# Patient Record
Sex: Female | Born: 1950 | Race: Black or African American | Hispanic: No | Marital: Married
Health system: Southern US, Community
[De-identification: ages and names within clinical notes are randomized; demographics above are authoritative.]

---

## 2001-04-14 ENCOUNTER — Encounter: Admission: RE | Admit: 2001-04-14 | Discharge: 2001-04-14 | Payer: Self-pay | Admitting: Family Medicine

## 2001-04-14 ENCOUNTER — Encounter: Payer: Self-pay | Admitting: Family Medicine

## 2002-04-15 ENCOUNTER — Encounter: Admission: RE | Admit: 2002-04-15 | Discharge: 2002-04-15 | Payer: Self-pay | Admitting: Family Medicine

## 2002-04-15 ENCOUNTER — Encounter: Payer: Self-pay | Admitting: Family Medicine

## 2005-04-23 ENCOUNTER — Encounter: Admission: RE | Admit: 2005-04-23 | Discharge: 2005-04-23 | Payer: Self-pay | Admitting: Family Medicine

## 2007-12-14 ENCOUNTER — Other Ambulatory Visit: Admission: RE | Admit: 2007-12-14 | Discharge: 2007-12-14 | Payer: Self-pay | Admitting: Family Medicine

## 2007-12-16 ENCOUNTER — Encounter: Admission: RE | Admit: 2007-12-16 | Discharge: 2007-12-16 | Payer: Self-pay | Admitting: Family Medicine

## 2008-12-19 ENCOUNTER — Encounter: Admission: RE | Admit: 2008-12-19 | Discharge: 2008-12-19 | Payer: Self-pay | Admitting: Family Medicine

## 2008-12-22 ENCOUNTER — Encounter: Admission: RE | Admit: 2008-12-22 | Discharge: 2008-12-22 | Payer: Self-pay | Admitting: Family Medicine

## 2009-06-20 ENCOUNTER — Encounter: Admission: RE | Admit: 2009-06-20 | Discharge: 2009-06-20 | Payer: Self-pay | Admitting: Family Medicine

## 2009-12-19 ENCOUNTER — Encounter: Admission: RE | Admit: 2009-12-19 | Discharge: 2009-12-19 | Payer: Self-pay | Admitting: Family Medicine

## 2010-04-01 ENCOUNTER — Ambulatory Visit: Payer: Self-pay | Admitting: Vascular Surgery

## 2010-10-29 NOTE — Procedures (Signed)
DUPLEX DEEP VENOUS EXAM - LOWER EXTREMITY   INDICATION:  Pain and swelling.   HISTORY:  Edema:  Complaint of left greater than right lower extremity  swelling for 2 weeks.  Trauma/Surgery:  No.  Pain:  Left thigh pain, mostly at night.  PE:  No.  Previous DVT:  No.  Anticoagulants:  Other:   DUPLEX EXAM:                CFV   SFV   PopV  PTV    GSV                R  L  R  L  R  L  R   L  R  L  Thrombosis    o  o     o     o      o     o  Spontaneous   +  +     +     +      +     +  Phasic        +  +     +     +      +     +  Augmentation  +  +     +     +      +     +  Compressible  +  +     +     +      +     +  Competent     +  +     +     +      +     +   Legend:  + - yes  o - no  p - partial  D - decreased   IMPRESSION:  1. No evidence of deep or superficial venous thrombosis noted in the      left lower extremity.  2. No cystic structures were visualized within the left popliteal      fossa.    _____________________________  Janetta Hora. Fields, MD   CH/MEDQ  D:  04/02/2010  T:  04/02/2010  Job:  [id number]

## 2010-12-11 ENCOUNTER — Other Ambulatory Visit (HOSPITAL_COMMUNITY)
Admission: RE | Admit: 2010-12-11 | Discharge: 2010-12-11 | Disposition: A | Discharge status: Home / Self Care | Payer: BC Managed Care – PPO | Source: Ambulatory Visit | Attending: Family Medicine | Admitting: Family Medicine

## 2010-12-11 DIAGNOSIS — Z124 Encounter for screening for malignant neoplasm of cervix: Secondary | ICD-10-CM | POA: Insufficient documentation

## 2011-01-10 ENCOUNTER — Other Ambulatory Visit: Payer: Self-pay | Admitting: Family Medicine

## 2011-01-10 DIAGNOSIS — Z1231 Encounter for screening mammogram for malignant neoplasm of breast: Secondary | ICD-10-CM

## 2011-02-06 ENCOUNTER — Ambulatory Visit
Admission: RE | Admit: 2011-02-06 | Discharge: 2011-02-06 | Disposition: A | Discharge status: Home / Self Care | Payer: BC Managed Care – PPO | Source: Ambulatory Visit | Attending: Family Medicine | Admitting: Family Medicine

## 2011-02-06 DIAGNOSIS — Z1231 Encounter for screening mammogram for malignant neoplasm of breast: Secondary | ICD-10-CM

## 2012-01-08 ENCOUNTER — Other Ambulatory Visit: Payer: Self-pay | Admitting: Family Medicine

## 2012-01-08 DIAGNOSIS — Z1231 Encounter for screening mammogram for malignant neoplasm of breast: Secondary | ICD-10-CM

## 2012-02-10 ENCOUNTER — Ambulatory Visit
Admission: RE | Admit: 2012-02-10 | Discharge: 2012-02-10 | Disposition: A | Discharge status: Home / Self Care | Payer: BC Managed Care – PPO | Source: Ambulatory Visit | Attending: Family Medicine | Admitting: Family Medicine

## 2012-02-10 DIAGNOSIS — Z1231 Encounter for screening mammogram for malignant neoplasm of breast: Secondary | ICD-10-CM

## 2013-01-05 ENCOUNTER — Other Ambulatory Visit: Payer: Self-pay

## 2013-01-05 DIAGNOSIS — Z1231 Encounter for screening mammogram for malignant neoplasm of breast: Secondary | ICD-10-CM

## 2013-02-10 ENCOUNTER — Ambulatory Visit
Admission: RE | Admit: 2013-02-10 | Discharge: 2013-02-10 | Disposition: A | Discharge status: Home / Self Care | Payer: BC Managed Care – PPO | Source: Ambulatory Visit

## 2013-02-10 DIAGNOSIS — Z1231 Encounter for screening mammogram for malignant neoplasm of breast: Secondary | ICD-10-CM

## 2014-02-02 ENCOUNTER — Other Ambulatory Visit: Payer: Self-pay | Admitting: Family Medicine

## 2014-02-02 ENCOUNTER — Other Ambulatory Visit (HOSPITAL_COMMUNITY)
Admission: RE | Admit: 2014-02-02 | Discharge: 2014-02-02 | Disposition: A | Discharge status: Home / Self Care | Payer: BC Managed Care – PPO | Source: Ambulatory Visit | Attending: Family Medicine | Admitting: Family Medicine

## 2014-02-02 DIAGNOSIS — Z124 Encounter for screening for malignant neoplasm of cervix: Secondary | ICD-10-CM | POA: Diagnosis not present

## 2014-02-03 LAB — CYTOLOGY - PAP

## 2014-04-05 ENCOUNTER — Other Ambulatory Visit: Payer: Self-pay

## 2014-04-05 DIAGNOSIS — Z1231 Encounter for screening mammogram for malignant neoplasm of breast: Secondary | ICD-10-CM

## 2014-04-28 ENCOUNTER — Ambulatory Visit
Admission: RE | Admit: 2014-04-28 | Discharge: 2014-04-28 | Disposition: A | Discharge status: Home / Self Care | Payer: BC Managed Care – PPO | Source: Ambulatory Visit

## 2014-04-28 DIAGNOSIS — Z1231 Encounter for screening mammogram for malignant neoplasm of breast: Secondary | ICD-10-CM

## 2015-04-11 ENCOUNTER — Other Ambulatory Visit: Payer: Self-pay

## 2015-04-11 DIAGNOSIS — Z1231 Encounter for screening mammogram for malignant neoplasm of breast: Secondary | ICD-10-CM

## 2015-05-01 ENCOUNTER — Ambulatory Visit
Admission: RE | Admit: 2015-05-01 | Discharge: 2015-05-01 | Disposition: A | Discharge status: Home / Self Care | Payer: Medicare Other | Source: Ambulatory Visit

## 2015-05-01 DIAGNOSIS — Z1231 Encounter for screening mammogram for malignant neoplasm of breast: Secondary | ICD-10-CM

## 2016-05-28 ENCOUNTER — Other Ambulatory Visit: Payer: Self-pay | Admitting: Family Medicine

## 2016-05-28 DIAGNOSIS — Z1231 Encounter for screening mammogram for malignant neoplasm of breast: Secondary | ICD-10-CM

## 2016-06-30 ENCOUNTER — Ambulatory Visit
Admission: RE | Admit: 2016-06-30 | Discharge: 2016-06-30 | Disposition: A | Discharge status: Home / Self Care | Payer: Medicare Other | Source: Ambulatory Visit | Attending: Family Medicine | Admitting: Family Medicine

## 2016-06-30 DIAGNOSIS — Z1231 Encounter for screening mammogram for malignant neoplasm of breast: Secondary | ICD-10-CM

## 2017-09-15 ENCOUNTER — Other Ambulatory Visit: Payer: Self-pay | Admitting: Family Medicine

## 2017-09-15 DIAGNOSIS — Z1231 Encounter for screening mammogram for malignant neoplasm of breast: Secondary | ICD-10-CM

## 2017-10-09 ENCOUNTER — Ambulatory Visit
Admission: RE | Admit: 2017-10-09 | Discharge: 2017-10-09 | Disposition: A | Discharge status: Home / Self Care | Payer: Medicare HMO | Source: Ambulatory Visit | Attending: Family Medicine | Admitting: Family Medicine

## 2017-10-09 DIAGNOSIS — Z1231 Encounter for screening mammogram for malignant neoplasm of breast: Secondary | ICD-10-CM | POA: Diagnosis not present

## 2017-11-16 DIAGNOSIS — R69 Illness, unspecified: Secondary | ICD-10-CM | POA: Diagnosis not present

## 2017-11-16 DIAGNOSIS — Z823 Family history of stroke: Secondary | ICD-10-CM | POA: Diagnosis not present

## 2017-11-16 DIAGNOSIS — Z8249 Family history of ischemic heart disease and other diseases of the circulatory system: Secondary | ICD-10-CM | POA: Diagnosis not present

## 2017-11-16 DIAGNOSIS — Z833 Family history of diabetes mellitus: Secondary | ICD-10-CM | POA: Diagnosis not present

## 2017-11-16 DIAGNOSIS — Z809 Family history of malignant neoplasm, unspecified: Secondary | ICD-10-CM | POA: Diagnosis not present

## 2017-11-16 DIAGNOSIS — Z6830 Body mass index (BMI) 30.0-30.9, adult: Secondary | ICD-10-CM | POA: Diagnosis not present

## 2017-11-16 DIAGNOSIS — I10 Essential (primary) hypertension: Secondary | ICD-10-CM | POA: Diagnosis not present

## 2017-11-16 DIAGNOSIS — E669 Obesity, unspecified: Secondary | ICD-10-CM | POA: Diagnosis not present

## 2017-11-16 DIAGNOSIS — E785 Hyperlipidemia, unspecified: Secondary | ICD-10-CM | POA: Diagnosis not present

## 2017-12-25 DIAGNOSIS — L259 Unspecified contact dermatitis, unspecified cause: Secondary | ICD-10-CM | POA: Diagnosis not present

## 2018-05-06 DIAGNOSIS — Z23 Encounter for immunization: Secondary | ICD-10-CM | POA: Diagnosis not present

## 2018-05-06 DIAGNOSIS — I10 Essential (primary) hypertension: Secondary | ICD-10-CM | POA: Diagnosis not present

## 2018-05-06 DIAGNOSIS — E7800 Pure hypercholesterolemia, unspecified: Secondary | ICD-10-CM | POA: Diagnosis not present

## 2018-05-06 DIAGNOSIS — Z1389 Encounter for screening for other disorder: Secondary | ICD-10-CM | POA: Diagnosis not present

## 2018-05-06 DIAGNOSIS — Z0000 Encounter for general adult medical examination without abnormal findings: Secondary | ICD-10-CM | POA: Diagnosis not present

## 2018-05-06 DIAGNOSIS — Z1211 Encounter for screening for malignant neoplasm of colon: Secondary | ICD-10-CM | POA: Diagnosis not present

## 2018-06-01 DIAGNOSIS — K640 First degree hemorrhoids: Secondary | ICD-10-CM | POA: Diagnosis not present

## 2018-06-01 DIAGNOSIS — Z1211 Encounter for screening for malignant neoplasm of colon: Secondary | ICD-10-CM | POA: Diagnosis not present

## 2018-06-01 DIAGNOSIS — D126 Benign neoplasm of colon, unspecified: Secondary | ICD-10-CM | POA: Diagnosis not present

## 2018-06-01 DIAGNOSIS — K5730 Diverticulosis of large intestine without perforation or abscess without bleeding: Secondary | ICD-10-CM | POA: Diagnosis not present

## 2018-06-02 DIAGNOSIS — D126 Benign neoplasm of colon, unspecified: Secondary | ICD-10-CM | POA: Diagnosis not present

## 2018-06-30 DIAGNOSIS — J069 Acute upper respiratory infection, unspecified: Secondary | ICD-10-CM | POA: Diagnosis not present

## 2018-06-30 DIAGNOSIS — M25571 Pain in right ankle and joints of right foot: Secondary | ICD-10-CM | POA: Diagnosis not present

## 2018-08-25 DIAGNOSIS — Z86010 Personal history of colonic polyps: Secondary | ICD-10-CM | POA: Diagnosis not present

## 2018-08-25 DIAGNOSIS — D128 Benign neoplasm of rectum: Secondary | ICD-10-CM | POA: Diagnosis not present

## 2018-08-25 DIAGNOSIS — K621 Rectal polyp: Secondary | ICD-10-CM | POA: Diagnosis not present

## 2018-08-25 DIAGNOSIS — I10 Essential (primary) hypertension: Secondary | ICD-10-CM | POA: Diagnosis not present

## 2018-08-25 DIAGNOSIS — E785 Hyperlipidemia, unspecified: Secondary | ICD-10-CM | POA: Diagnosis not present

## 2018-08-25 DIAGNOSIS — K5730 Diverticulosis of large intestine without perforation or abscess without bleeding: Secondary | ICD-10-CM | POA: Diagnosis not present

## 2018-11-11 ENCOUNTER — Other Ambulatory Visit: Payer: Self-pay | Admitting: Family Medicine

## 2018-11-11 DIAGNOSIS — Z1231 Encounter for screening mammogram for malignant neoplasm of breast: Secondary | ICD-10-CM

## 2019-01-31 ENCOUNTER — Other Ambulatory Visit: Payer: Self-pay

## 2019-01-31 ENCOUNTER — Ambulatory Visit
Admission: RE | Admit: 2019-01-31 | Discharge: 2019-01-31 | Disposition: A | Discharge status: Home / Self Care | Payer: Medicare HMO | Source: Ambulatory Visit | Attending: Family Medicine | Admitting: Family Medicine

## 2019-01-31 DIAGNOSIS — Z1231 Encounter for screening mammogram for malignant neoplasm of breast: Secondary | ICD-10-CM | POA: Diagnosis not present

## 2019-03-14 DIAGNOSIS — Z1211 Encounter for screening for malignant neoplasm of colon: Secondary | ICD-10-CM | POA: Diagnosis not present

## 2019-03-14 DIAGNOSIS — K6389 Other specified diseases of intestine: Secondary | ICD-10-CM | POA: Diagnosis not present

## 2019-03-14 DIAGNOSIS — D128 Benign neoplasm of rectum: Secondary | ICD-10-CM | POA: Diagnosis not present

## 2019-03-14 DIAGNOSIS — K621 Rectal polyp: Secondary | ICD-10-CM | POA: Diagnosis not present

## 2019-03-14 DIAGNOSIS — K5730 Diverticulosis of large intestine without perforation or abscess without bleeding: Secondary | ICD-10-CM | POA: Diagnosis not present

## 2019-03-14 DIAGNOSIS — Z86010 Personal history of colonic polyps: Secondary | ICD-10-CM | POA: Diagnosis not present

## 2019-03-14 DIAGNOSIS — K635 Polyp of colon: Secondary | ICD-10-CM | POA: Diagnosis not present

## 2019-05-31 DIAGNOSIS — E7800 Pure hypercholesterolemia, unspecified: Secondary | ICD-10-CM | POA: Diagnosis not present

## 2019-05-31 DIAGNOSIS — Z86010 Personal history of colonic polyps: Secondary | ICD-10-CM | POA: Diagnosis not present

## 2019-05-31 DIAGNOSIS — Z0000 Encounter for general adult medical examination without abnormal findings: Secondary | ICD-10-CM | POA: Diagnosis not present

## 2019-05-31 DIAGNOSIS — I10 Essential (primary) hypertension: Secondary | ICD-10-CM | POA: Diagnosis not present

## 2019-05-31 DIAGNOSIS — Z1389 Encounter for screening for other disorder: Secondary | ICD-10-CM | POA: Diagnosis not present

## 2019-06-03 DIAGNOSIS — I10 Essential (primary) hypertension: Secondary | ICD-10-CM | POA: Diagnosis not present

## 2019-06-03 DIAGNOSIS — E7800 Pure hypercholesterolemia, unspecified: Secondary | ICD-10-CM | POA: Diagnosis not present

## 2019-08-16 DIAGNOSIS — Z803 Family history of malignant neoplasm of breast: Secondary | ICD-10-CM | POA: Diagnosis not present

## 2019-08-16 DIAGNOSIS — Z823 Family history of stroke: Secondary | ICD-10-CM | POA: Diagnosis not present

## 2019-08-16 DIAGNOSIS — Z809 Family history of malignant neoplasm, unspecified: Secondary | ICD-10-CM | POA: Diagnosis not present

## 2019-08-16 DIAGNOSIS — R69 Illness, unspecified: Secondary | ICD-10-CM | POA: Diagnosis not present

## 2019-08-16 DIAGNOSIS — Z833 Family history of diabetes mellitus: Secondary | ICD-10-CM | POA: Diagnosis not present

## 2019-08-16 DIAGNOSIS — I10 Essential (primary) hypertension: Secondary | ICD-10-CM | POA: Diagnosis not present

## 2019-08-16 DIAGNOSIS — Z8249 Family history of ischemic heart disease and other diseases of the circulatory system: Secondary | ICD-10-CM | POA: Diagnosis not present

## 2019-08-16 DIAGNOSIS — Z7982 Long term (current) use of aspirin: Secondary | ICD-10-CM | POA: Diagnosis not present

## 2019-08-16 DIAGNOSIS — E785 Hyperlipidemia, unspecified: Secondary | ICD-10-CM | POA: Diagnosis not present

## 2019-11-21 DIAGNOSIS — H5213 Myopia, bilateral: Secondary | ICD-10-CM | POA: Diagnosis not present

## 2019-11-21 DIAGNOSIS — Z0100 Encounter for examination of eyes and vision without abnormal findings: Secondary | ICD-10-CM | POA: Diagnosis not present

## 2019-12-22 ENCOUNTER — Other Ambulatory Visit: Payer: Self-pay | Admitting: Family Medicine

## 2019-12-22 DIAGNOSIS — Z1231 Encounter for screening mammogram for malignant neoplasm of breast: Secondary | ICD-10-CM

## 2020-02-02 ENCOUNTER — Ambulatory Visit
Admission: RE | Admit: 2020-02-02 | Discharge: 2020-02-02 | Disposition: A | Discharge status: Home / Self Care | Payer: Medicare HMO | Source: Ambulatory Visit | Attending: Family Medicine | Admitting: Family Medicine

## 2020-02-02 DIAGNOSIS — Z1231 Encounter for screening mammogram for malignant neoplasm of breast: Secondary | ICD-10-CM | POA: Diagnosis not present

## 2020-02-06 ENCOUNTER — Other Ambulatory Visit: Payer: Self-pay | Admitting: Family Medicine

## 2020-02-06 DIAGNOSIS — R928 Other abnormal and inconclusive findings on diagnostic imaging of breast: Secondary | ICD-10-CM

## 2020-02-22 ENCOUNTER — Ambulatory Visit
Admission: RE | Admit: 2020-02-22 | Discharge: 2020-02-22 | Disposition: A | Discharge status: Home / Self Care | Payer: Medicare HMO | Source: Ambulatory Visit | Attending: Family Medicine | Admitting: Family Medicine

## 2020-02-22 ENCOUNTER — Other Ambulatory Visit: Payer: Self-pay

## 2020-02-22 ENCOUNTER — Ambulatory Visit: Payer: Medicare HMO

## 2020-02-22 DIAGNOSIS — R928 Other abnormal and inconclusive findings on diagnostic imaging of breast: Secondary | ICD-10-CM | POA: Diagnosis not present

## 2020-03-02 DIAGNOSIS — I10 Essential (primary) hypertension: Secondary | ICD-10-CM | POA: Diagnosis not present

## 2020-03-02 DIAGNOSIS — Z23 Encounter for immunization: Secondary | ICD-10-CM | POA: Diagnosis not present

## 2020-03-21 DIAGNOSIS — Z1211 Encounter for screening for malignant neoplasm of colon: Secondary | ICD-10-CM | POA: Diagnosis not present

## 2020-03-21 DIAGNOSIS — L905 Scar conditions and fibrosis of skin: Secondary | ICD-10-CM | POA: Diagnosis not present

## 2020-03-21 DIAGNOSIS — Z86010 Personal history of colonic polyps: Secondary | ICD-10-CM | POA: Diagnosis not present

## 2020-03-21 DIAGNOSIS — Z8719 Personal history of other diseases of the digestive system: Secondary | ICD-10-CM | POA: Diagnosis not present

## 2020-03-21 DIAGNOSIS — K5730 Diverticulosis of large intestine without perforation or abscess without bleeding: Secondary | ICD-10-CM | POA: Diagnosis not present

## 2020-03-21 DIAGNOSIS — D125 Benign neoplasm of sigmoid colon: Secondary | ICD-10-CM | POA: Diagnosis not present

## 2020-03-21 DIAGNOSIS — D127 Benign neoplasm of rectosigmoid junction: Secondary | ICD-10-CM | POA: Diagnosis not present

## 2020-04-13 DIAGNOSIS — I10 Essential (primary) hypertension: Secondary | ICD-10-CM | POA: Diagnosis not present

## 2020-06-07 DIAGNOSIS — Z1389 Encounter for screening for other disorder: Secondary | ICD-10-CM | POA: Diagnosis not present

## 2020-06-07 DIAGNOSIS — I10 Essential (primary) hypertension: Secondary | ICD-10-CM | POA: Diagnosis not present

## 2020-06-07 DIAGNOSIS — Z23 Encounter for immunization: Secondary | ICD-10-CM | POA: Diagnosis not present

## 2020-06-07 DIAGNOSIS — Z86010 Personal history of colonic polyps: Secondary | ICD-10-CM | POA: Diagnosis not present

## 2020-06-07 DIAGNOSIS — Z0000 Encounter for general adult medical examination without abnormal findings: Secondary | ICD-10-CM | POA: Diagnosis not present

## 2020-06-07 DIAGNOSIS — E7800 Pure hypercholesterolemia, unspecified: Secondary | ICD-10-CM | POA: Diagnosis not present

## 2020-08-10 DIAGNOSIS — Z9181 History of falling: Secondary | ICD-10-CM | POA: Diagnosis not present

## 2020-08-10 DIAGNOSIS — Z008 Encounter for other general examination: Secondary | ICD-10-CM | POA: Diagnosis not present

## 2020-08-10 DIAGNOSIS — Z833 Family history of diabetes mellitus: Secondary | ICD-10-CM | POA: Diagnosis not present

## 2020-08-10 DIAGNOSIS — I739 Peripheral vascular disease, unspecified: Secondary | ICD-10-CM | POA: Diagnosis not present

## 2020-08-10 DIAGNOSIS — Z803 Family history of malignant neoplasm of breast: Secondary | ICD-10-CM | POA: Diagnosis not present

## 2020-08-10 DIAGNOSIS — Z7982 Long term (current) use of aspirin: Secondary | ICD-10-CM | POA: Diagnosis not present

## 2020-08-10 DIAGNOSIS — Z8249 Family history of ischemic heart disease and other diseases of the circulatory system: Secondary | ICD-10-CM | POA: Diagnosis not present

## 2020-08-10 DIAGNOSIS — E785 Hyperlipidemia, unspecified: Secondary | ICD-10-CM | POA: Diagnosis not present

## 2020-08-10 DIAGNOSIS — Z823 Family history of stroke: Secondary | ICD-10-CM | POA: Diagnosis not present

## 2020-08-10 DIAGNOSIS — I10 Essential (primary) hypertension: Secondary | ICD-10-CM | POA: Diagnosis not present

## 2020-11-23 DIAGNOSIS — R21 Rash and other nonspecific skin eruption: Secondary | ICD-10-CM | POA: Diagnosis not present

## 2021-02-01 ENCOUNTER — Other Ambulatory Visit: Payer: Self-pay | Admitting: Family Medicine

## 2021-02-01 DIAGNOSIS — Z1231 Encounter for screening mammogram for malignant neoplasm of breast: Secondary | ICD-10-CM

## 2021-02-04 DIAGNOSIS — Z20822 Contact with and (suspected) exposure to covid-19: Secondary | ICD-10-CM | POA: Diagnosis not present

## 2021-03-19 ENCOUNTER — Ambulatory Visit
Admission: RE | Admit: 2021-03-19 | Discharge: 2021-03-19 | Disposition: A | Discharge status: Home / Self Care | Payer: Medicare HMO | Source: Ambulatory Visit | Attending: Family Medicine | Admitting: Family Medicine

## 2021-03-19 ENCOUNTER — Other Ambulatory Visit: Payer: Self-pay

## 2021-03-19 DIAGNOSIS — Z1231 Encounter for screening mammogram for malignant neoplasm of breast: Secondary | ICD-10-CM | POA: Diagnosis not present

## 2021-03-22 DIAGNOSIS — K5730 Diverticulosis of large intestine without perforation or abscess without bleeding: Secondary | ICD-10-CM | POA: Diagnosis not present

## 2021-03-22 DIAGNOSIS — D128 Benign neoplasm of rectum: Secondary | ICD-10-CM | POA: Diagnosis not present

## 2021-03-22 DIAGNOSIS — Z1211 Encounter for screening for malignant neoplasm of colon: Secondary | ICD-10-CM | POA: Diagnosis not present

## 2021-03-22 DIAGNOSIS — Z86010 Personal history of colonic polyps: Secondary | ICD-10-CM | POA: Diagnosis not present

## 2021-03-22 DIAGNOSIS — K621 Rectal polyp: Secondary | ICD-10-CM | POA: Diagnosis not present

## 2021-04-16 DIAGNOSIS — H43813 Vitreous degeneration, bilateral: Secondary | ICD-10-CM | POA: Diagnosis not present

## 2021-04-19 DIAGNOSIS — Z23 Encounter for immunization: Secondary | ICD-10-CM | POA: Diagnosis not present

## 2021-09-18 DIAGNOSIS — E7800 Pure hypercholesterolemia, unspecified: Secondary | ICD-10-CM | POA: Diagnosis not present

## 2021-09-18 DIAGNOSIS — Z79899 Other long term (current) drug therapy: Secondary | ICD-10-CM | POA: Diagnosis not present

## 2022-01-09 DIAGNOSIS — Z791 Long term (current) use of non-steroidal anti-inflammatories (NSAID): Secondary | ICD-10-CM | POA: Diagnosis not present

## 2022-01-09 DIAGNOSIS — H269 Unspecified cataract: Secondary | ICD-10-CM | POA: Diagnosis not present

## 2022-01-09 DIAGNOSIS — Z7982 Long term (current) use of aspirin: Secondary | ICD-10-CM | POA: Diagnosis not present

## 2022-01-09 DIAGNOSIS — I10 Essential (primary) hypertension: Secondary | ICD-10-CM | POA: Diagnosis not present

## 2022-01-09 DIAGNOSIS — I739 Peripheral vascular disease, unspecified: Secondary | ICD-10-CM | POA: Diagnosis not present

## 2022-01-09 DIAGNOSIS — E785 Hyperlipidemia, unspecified: Secondary | ICD-10-CM | POA: Diagnosis not present

## 2022-01-09 DIAGNOSIS — G8929 Other chronic pain: Secondary | ICD-10-CM | POA: Diagnosis not present

## 2022-01-09 DIAGNOSIS — F439 Reaction to severe stress, unspecified: Secondary | ICD-10-CM | POA: Diagnosis not present

## 2022-01-09 DIAGNOSIS — Z809 Family history of malignant neoplasm, unspecified: Secondary | ICD-10-CM | POA: Diagnosis not present

## 2022-01-12 ENCOUNTER — Emergency Department (HOSPITAL_COMMUNITY): Payer: Medicare PPO

## 2022-01-12 ENCOUNTER — Other Ambulatory Visit: Payer: Self-pay

## 2022-01-12 ENCOUNTER — Emergency Department (HOSPITAL_COMMUNITY)
Admission: EM | Admit: 2022-01-12 | Discharge: 2022-01-12 | Disposition: A | Discharge status: Home / Self Care | Payer: Medicare PPO | Attending: Emergency Medicine | Admitting: Emergency Medicine

## 2022-01-12 DIAGNOSIS — N179 Acute kidney failure, unspecified: Secondary | ICD-10-CM | POA: Insufficient documentation

## 2022-01-12 DIAGNOSIS — R000 Tachycardia, unspecified: Secondary | ICD-10-CM | POA: Diagnosis not present

## 2022-01-12 DIAGNOSIS — R0902 Hypoxemia: Secondary | ICD-10-CM | POA: Diagnosis not present

## 2022-01-12 DIAGNOSIS — R55 Syncope and collapse: Secondary | ICD-10-CM | POA: Insufficient documentation

## 2022-01-12 DIAGNOSIS — I10 Essential (primary) hypertension: Secondary | ICD-10-CM | POA: Diagnosis not present

## 2022-01-12 DIAGNOSIS — R42 Dizziness and giddiness: Secondary | ICD-10-CM | POA: Diagnosis not present

## 2022-01-12 DIAGNOSIS — E860 Dehydration: Secondary | ICD-10-CM | POA: Diagnosis not present

## 2022-01-12 DIAGNOSIS — Z743 Need for continuous supervision: Secondary | ICD-10-CM | POA: Diagnosis not present

## 2022-01-12 LAB — COMPREHENSIVE METABOLIC PANEL
ALT: 13 U/L (ref 0–44)
AST: 20 U/L (ref 15–41)
Albumin: 3.6 g/dL (ref 3.5–5.0)
Alkaline Phosphatase: 84 U/L (ref 38–126)
Anion gap: 11 (ref 5–15)
BUN: 27 mg/dL — ABNORMAL HIGH (ref 8–23)
CO2: 19 mmol/L — ABNORMAL LOW (ref 22–32)
Calcium: 9 mg/dL (ref 8.9–10.3)
Chloride: 107 mmol/L (ref 98–111)
Creatinine, Ser: 2.02 mg/dL — ABNORMAL HIGH (ref 0.44–1.00)
GFR, Estimated: 26 mL/min — ABNORMAL LOW (ref >60)
Glucose, Bld: 178 mg/dL — ABNORMAL HIGH (ref 70–99)
Potassium: 3.7 mmol/L (ref 3.5–5.1)
Sodium: 137 mmol/L (ref 135–145)
Total Bilirubin: 0.5 mg/dL (ref 0.3–1.2)
Total Protein: 6.8 g/dL (ref 6.5–8.1)

## 2022-01-12 LAB — MAGNESIUM: Magnesium: 1.9 mg/dL (ref 1.7–2.4)

## 2022-01-12 LAB — LIPASE, BLOOD: Lipase: 35 U/L (ref 11–51)

## 2022-01-12 LAB — TROPONIN I (HIGH SENSITIVITY)
Troponin I (High Sensitivity): 16 ng/L (ref <18)
Troponin I (High Sensitivity): 7 ng/L (ref <18)

## 2022-01-12 LAB — CBC WITH DIFFERENTIAL/PLATELET
Abs Immature Granulocytes: 0.04 10*3/uL (ref 0.00–0.07)
Basophils Absolute: 0.1 10*3/uL (ref 0.0–0.1)
Basophils Relative: 0 %
Eosinophils Absolute: 0 10*3/uL (ref 0.0–0.5)
Eosinophils Relative: 0 %
HCT: 42.5 % (ref 36.0–46.0)
Hemoglobin: 14.1 g/dL (ref 12.0–15.0)
Immature Granulocytes: 0 %
Lymphocytes Relative: 25 %
Lymphs Abs: 3 10*3/uL (ref 0.7–4.0)
MCH: 29.1 pg (ref 26.0–34.0)
MCHC: 33.2 g/dL (ref 30.0–36.0)
MCV: 87.6 fL (ref 80.0–100.0)
Monocytes Absolute: 0.9 10*3/uL (ref 0.1–1.0)
Monocytes Relative: 7 %
Neutro Abs: 8.2 10*3/uL — ABNORMAL HIGH (ref 1.7–7.7)
Neutrophils Relative %: 68 %
Platelets: 272 10*3/uL (ref 150–400)
RBC: 4.85 MIL/uL (ref 3.87–5.11)
RDW: 14.2 % (ref 11.5–15.5)
WBC: 12.2 10*3/uL — ABNORMAL HIGH (ref 4.0–10.5)
nRBC: 0 % (ref 0.0–0.2)

## 2022-01-12 LAB — URINALYSIS, ROUTINE W REFLEX MICROSCOPIC
Bilirubin Urine: NEGATIVE
Glucose, UA: NEGATIVE mg/dL
Hgb urine dipstick: NEGATIVE
Ketones, ur: 20 mg/dL — AB
Leukocytes,Ua: NEGATIVE
Nitrite: NEGATIVE
Protein, ur: NEGATIVE mg/dL
Specific Gravity, Urine: 1.009 (ref 1.005–1.030)
pH: 5 (ref 5.0–8.0)

## 2022-01-12 MED ORDER — SODIUM CHLORIDE 0.9 % IV SOLN
12.5000 mg | Freq: Once | INTRAVENOUS | Status: DC
  Filled 2022-01-12: qty 0.5

## 2022-01-12 MED ORDER — SODIUM CHLORIDE 0.9 % IV BOLUS
1000.0000 mL | Freq: Once | INTRAVENOUS | Status: AC
  Administered 2022-01-12: 1000 mL via INTRAVENOUS

## 2022-01-12 MED ORDER — SODIUM CHLORIDE 0.9 % IV SOLN: INTRAVENOUS | Status: DC

## 2022-01-12 NOTE — ED Triage Notes (Signed)
[facility] reports pt coming from church do to getting sweaty and dizzy. Pt c/o abdominal pain and nausea. EMS gave 500NS and '4mg'$  of Zofran.

## 2022-01-12 NOTE — ED Notes (Signed)
Pt ambulated in the hallway. Pt gait is steady and pt states she feels "fine".

## 2022-01-12 NOTE — Discharge Instructions (Addendum)
Your kidney function is off today.  This is likely due to dehydration.  Make sure you are drinking plenty of fluids.  You also need to follow up with your doctor to get your kidney function rechecked.

## 2022-01-12 NOTE — ED Provider Notes (Signed)
[facility] EMERGENCY DEPARTMENT Provider Note   CSN: [id number] Arrival date & time: 01/12/22  1306     History  Chief Complaint  Patient presents with   Near Syncope    Caitlin Briggs is a 71 y.o. female.  Pt is a 71 yo female with a pmhx significant for HTN, high cholesterol, and a tubulovillous adenoma of rectum.  The pt said she went to church today and suddenly felt dizzy and sweaty.  She has upper abd pain and feels nauseous.  She said it was not hot in the church.  She did not eat this morning.  She was given 500 cc NS and 4 mg Zofran IV by EMS.  She still feels nauseous.  She denies cp.  No sob.  No fevers.  She felt fine this am before leaving for church.       Home Medications Prior to Admission medications   Not on File      Allergies    Patient has no known allergies.    Review of Systems   Review of Systems  Gastrointestinal:  Positive for abdominal pain, nausea and vomiting.  Neurological:  Positive for weakness.  All other systems reviewed and are negative.   Physical Exam Updated Vital Signs BP (!) 176/86 (BP Location: Right Arm)   Pulse 80   Temp 97.8 F (36.6 C) (Oral)   Resp 16   Ht '5\' 6"'$  (1.676 m)   Wt 73.9 kg   SpO2 100%   BMI 26.31 kg/m  Physical Exam Vitals and nursing note reviewed.  Constitutional:      Appearance: Normal appearance.  HENT:     Head: Normocephalic and atraumatic.     Right Ear: External ear normal.     Left Ear: External ear normal.     Nose: Nose normal.     Mouth/Throat:     Mouth: Mucous membranes are moist.     Pharynx: Oropharynx is clear.  Eyes:     Extraocular Movements: Extraocular movements intact.     Conjunctiva/sclera: Conjunctivae normal.     Pupils: Pupils are equal, round, and reactive to light.  Cardiovascular:     Rate and Rhythm: Normal rate and regular rhythm.     Pulses: Normal pulses.     Heart sounds: Normal heart sounds.  Pulmonary:     Effort: Pulmonary effort is  normal.     Breath sounds: Normal breath sounds.  Abdominal:     General: Abdomen is flat. Bowel sounds are normal.     Palpations: Abdomen is soft.     Tenderness: There is abdominal tenderness in the epigastric area.  Musculoskeletal:        General: Normal range of motion.     Cervical back: Normal range of motion and neck supple.  Skin:    General: Skin is warm.     Capillary Refill: Capillary refill takes less than 2 seconds.  Neurological:     General: No focal deficit present.     Mental Status: She is alert and oriented to person, place, and time.  Psychiatric:        Mood and Affect: Mood normal.        Behavior: Behavior normal.     ED Results / Procedures / Treatments   Labs (all labs ordered are listed, but only abnormal results are displayed) Labs Reviewed  CBC WITH DIFFERENTIAL/PLATELET - Abnormal; Notable for the following components:      Result Value  WBC 12.2 (*)    Neutro Abs 8.2 (*)    All other components within normal limits  COMPREHENSIVE METABOLIC PANEL - Abnormal; Notable for the following components:   CO2 19 (*)    Glucose, Bld 178 (*)    BUN 27 (*)    Creatinine, Ser 2.02 (*)    GFR, Estimated 26 (*)    All other components within normal limits  URINALYSIS, ROUTINE W REFLEX MICROSCOPIC - Abnormal; Notable for the following components:   Color, Urine STRAW (*)    Ketones, ur 20 (*)    All other components within normal limits  LIPASE, BLOOD  MAGNESIUM  CBG MONITORING, ED  TROPONIN I (HIGH SENSITIVITY)  TROPONIN I (HIGH SENSITIVITY)    EKG EKG Interpretation  Date/Time:  Sunday January 12 2022 13:15:39 EDT Ventricular Rate:  78 PR Interval:  189 QRS Duration: 90 QT Interval:  451 QTC Calculation: 514 R Axis:   67 Text Interpretation: Sinus rhythm Probable left atrial enlargement Prolonged QT interval No old tracing to compare Confirmed by Isla Pence [id number]) on 01/12/2022 1:49:21 PM  Radiology DG Chest Port 1 View  Result Date:  01/12/2022 CLINICAL DATA:  Syncope. EXAM: PORTABLE CHEST 1 VIEW COMPARISON:  None Available. FINDINGS: The heart size and mediastinal contours are within normal limits. Aortic calcifications. Both lungs are clear. No pleural effusion or pneumothorax. The visualized skeletal structures are unremarkable. IMPRESSION: No acute cardiopulmonary abnormality. Electronically Signed   By: Ileana Roup M.D.   On: 01/12/2022 13:53    Procedures Procedures    Medications Ordered in ED Medications  sodium chloride 0.9 % bolus 1,000 mL (0 mLs Intravenous Stopped 01/12/22 1528)    And  0.9 %  sodium chloride infusion ( Intravenous Not Given 01/12/22 1426)  promethazine (PHENERGAN) 12.5 mg in sodium chloride 0.9 % 50 mL IVPB (0 mg Intravenous Hold 01/12/22 1426)    ED Course/ Medical Decision Making/ A&P                           Medical Decision Making Amount and/or Complexity of Data Reviewed Labs: ordered. Radiology: ordered. ECG/medicine tests: ordered.  Risk Prescription drug management.   This patient presents to the ED for concern of near-syncope, this involves an extensive number of treatment options, and is a complaint that carries with it a high risk of complications and morbidity.  The differential diagnosis includes cardiac, pulm, gi   Co morbidities that complicate the patient evaluation  HTN, high cholesterol, and a tubulovillous adenoma of rectum   Additional history obtained:  Additional history obtained from epic chart review External records from outside source obtained and reviewed including EMS report   Lab Tests:  I Ordered, and personally interpreted labs.  The pertinent results include:  cbc nl, cmp with cr bump to 2.02, lip nl, trop nl, mg nl, ua neg for uti, but it does have ketones   Imaging Studies ordered:  I ordered imaging studies including CXR  I independently visualized and interpreted imaging which showed  IMPRESSION:  No acute cardiopulmonary  abnormality.   I agree with the radiologist interpretation   Cardiac Monitoring:  The patient was maintained on a cardiac monitor.  I personally viewed and interpreted the cardiac monitored which showed an underlying rhythm of: nsr   Medicines ordered and prescription drug management:  I ordered medication including IVFs  for dehydration  Reevaluation of the patient after these medicines showed that the patient  improved I have reviewed the patients home medicines and have made adjustments as needed   Test Considered:  ct   Critical Interventions:  ivfs   Problem List / ED Course:  Near-syncope:  this is likely due to dehydration.  She has a new onset AKI (labs with pcp in April show a normal kidney function).  Pt feels much better after fluids.  Nausea and abd pain better after zofran.  She is able to drink fluids.  She is able to ambulate.  She wants to go home. She is encouraged to drink lots of fluids.  She knows to f/u with pcp for a bmp recheck.  She is to return if worse.   Reevaluation:  After the interventions noted above, I reevaluated the patient and found that they have :improved   Social Determinants of Health:  Lives at home    Dispostion:  After consideration of the diagnostic results and the patients response to treatment, I feel that the patent would benefit from discharge with outpatient f/u.          Final Clinical Impression(s) / ED Diagnoses Final diagnoses:  Near syncope  Dehydration  AKI (acute kidney injury) [facility])    Rx / [place] Orders ED Discharge Orders     None         Isla Pence, MD 01/12/22 [phone]

## 2022-01-22 ENCOUNTER — Telehealth: Payer: Self-pay

## 2022-01-22 NOTE — Telephone Encounter (Signed)
     Patient  visit on 7/31  at [facility]   Have you been able to follow up with your primary care physician? yes  The patient was or was not able to obtain any needed medicine or equipment. yes  Are there diet recommendations that you are having difficulty following?na  Patient expresses understanding of discharge instructions and education provided has no other needs at this time. Yes      [place], Care Management  [phone] [address], [place], [place] [address] Phone: [phone] Email: [email or web address]

## 2022-01-24 DIAGNOSIS — E7800 Pure hypercholesterolemia, unspecified: Secondary | ICD-10-CM | POA: Diagnosis not present

## 2022-01-24 DIAGNOSIS — I10 Essential (primary) hypertension: Secondary | ICD-10-CM | POA: Diagnosis not present

## 2022-01-24 DIAGNOSIS — R7309 Other abnormal glucose: Secondary | ICD-10-CM | POA: Diagnosis not present

## 2022-01-24 DIAGNOSIS — N179 Acute kidney failure, unspecified: Secondary | ICD-10-CM | POA: Diagnosis not present

## 2022-01-24 DIAGNOSIS — R55 Syncope and collapse: Secondary | ICD-10-CM | POA: Diagnosis not present

## 2022-03-03 ENCOUNTER — Other Ambulatory Visit: Payer: Self-pay | Admitting: Family Medicine

## 2022-03-03 DIAGNOSIS — Z1231 Encounter for screening mammogram for malignant neoplasm of breast: Secondary | ICD-10-CM

## 2022-03-04 ENCOUNTER — Other Ambulatory Visit: Payer: Self-pay | Admitting: Family Medicine

## 2022-03-04 DIAGNOSIS — N644 Mastodynia: Secondary | ICD-10-CM

## 2022-03-24 DIAGNOSIS — D128 Benign neoplasm of rectum: Secondary | ICD-10-CM | POA: Diagnosis not present

## 2022-03-24 DIAGNOSIS — Z1211 Encounter for screening for malignant neoplasm of colon: Secondary | ICD-10-CM | POA: Diagnosis not present

## 2022-03-24 DIAGNOSIS — Z86010 Personal history of colonic polyps: Secondary | ICD-10-CM | POA: Diagnosis not present

## 2022-04-05 DIAGNOSIS — Z23 Encounter for immunization: Secondary | ICD-10-CM | POA: Diagnosis not present

## 2022-04-22 ENCOUNTER — Ambulatory Visit
Admission: RE | Admit: 2022-04-22 | Discharge: 2022-04-22 | Disposition: A | Discharge status: Home / Self Care | Payer: Medicare PPO | Source: Ambulatory Visit | Attending: Family Medicine | Admitting: Family Medicine

## 2022-04-22 ENCOUNTER — Ambulatory Visit: Payer: Medicare PPO

## 2022-04-22 DIAGNOSIS — N644 Mastodynia: Secondary | ICD-10-CM

## 2022-06-19 DIAGNOSIS — M545 Low back pain, unspecified: Secondary | ICD-10-CM | POA: Diagnosis not present

## 2022-06-19 DIAGNOSIS — R0989 Other specified symptoms and signs involving the circulatory and respiratory systems: Secondary | ICD-10-CM | POA: Diagnosis not present

## 2022-06-19 DIAGNOSIS — Z1331 Encounter for screening for depression: Secondary | ICD-10-CM | POA: Diagnosis not present

## 2022-06-19 DIAGNOSIS — E7800 Pure hypercholesterolemia, unspecified: Secondary | ICD-10-CM | POA: Diagnosis not present

## 2022-06-19 DIAGNOSIS — I10 Essential (primary) hypertension: Secondary | ICD-10-CM | POA: Diagnosis not present

## 2022-06-19 DIAGNOSIS — R7309 Other abnormal glucose: Secondary | ICD-10-CM | POA: Diagnosis not present

## 2022-06-19 DIAGNOSIS — Z0000 Encounter for general adult medical examination without abnormal findings: Secondary | ICD-10-CM | POA: Diagnosis not present

## 2022-06-19 DIAGNOSIS — Z86010 Personal history of colonic polyps: Secondary | ICD-10-CM | POA: Diagnosis not present

## 2022-06-25 DIAGNOSIS — H43813 Vitreous degeneration, bilateral: Secondary | ICD-10-CM | POA: Diagnosis not present

## 2022-06-25 DIAGNOSIS — Z0100 Encounter for examination of eyes and vision without abnormal findings: Secondary | ICD-10-CM | POA: Diagnosis not present

## 2022-06-27 DIAGNOSIS — R0989 Other specified symptoms and signs involving the circulatory and respiratory systems: Secondary | ICD-10-CM | POA: Diagnosis not present

## 2022-06-27 DIAGNOSIS — I6523 Occlusion and stenosis of bilateral carotid arteries: Secondary | ICD-10-CM | POA: Diagnosis not present

## 2022-07-14 DIAGNOSIS — Z1159 Encounter for screening for other viral diseases: Secondary | ICD-10-CM | POA: Diagnosis not present

## 2022-12-02 DIAGNOSIS — Z9181 History of falling: Secondary | ICD-10-CM | POA: Diagnosis not present

## 2022-12-02 DIAGNOSIS — Z809 Family history of malignant neoplasm, unspecified: Secondary | ICD-10-CM | POA: Diagnosis not present

## 2022-12-02 DIAGNOSIS — Z008 Encounter for other general examination: Secondary | ICD-10-CM | POA: Diagnosis not present

## 2022-12-02 DIAGNOSIS — Z8249 Family history of ischemic heart disease and other diseases of the circulatory system: Secondary | ICD-10-CM | POA: Diagnosis not present

## 2022-12-02 DIAGNOSIS — I10 Essential (primary) hypertension: Secondary | ICD-10-CM | POA: Diagnosis not present

## 2022-12-02 DIAGNOSIS — Z823 Family history of stroke: Secondary | ICD-10-CM | POA: Diagnosis not present

## 2022-12-02 DIAGNOSIS — Z818 Family history of other mental and behavioral disorders: Secondary | ICD-10-CM | POA: Diagnosis not present

## 2022-12-02 DIAGNOSIS — E785 Hyperlipidemia, unspecified: Secondary | ICD-10-CM | POA: Diagnosis not present

## 2022-12-02 DIAGNOSIS — Z833 Family history of diabetes mellitus: Secondary | ICD-10-CM | POA: Diagnosis not present

## 2023-02-17 ENCOUNTER — Other Ambulatory Visit: Payer: Self-pay | Admitting: Gastroenterology

## 2023-03-30 DIAGNOSIS — K5730 Diverticulosis of large intestine without perforation or abscess without bleeding: Secondary | ICD-10-CM | POA: Diagnosis not present

## 2023-03-30 DIAGNOSIS — D128 Benign neoplasm of rectum: Secondary | ICD-10-CM | POA: Diagnosis not present

## 2023-03-31 ENCOUNTER — Ambulatory Visit (HOSPITAL_COMMUNITY): Admit: 2023-03-31 | Payer: Medicare HMO | Admitting: Gastroenterology

## 2023-03-31 ENCOUNTER — Encounter (HOSPITAL_COMMUNITY): Payer: Self-pay

## 2023-03-31 SURGERY — FLEXIBLE SIGMOIDOSCOPY
Anesthesia: Monitor Anesthesia Care

## 2023-04-06 ENCOUNTER — Other Ambulatory Visit: Payer: Self-pay | Admitting: Family Medicine

## 2023-04-06 DIAGNOSIS — Z1231 Encounter for screening mammogram for malignant neoplasm of breast: Secondary | ICD-10-CM

## 2023-04-18 DIAGNOSIS — Z23 Encounter for immunization: Secondary | ICD-10-CM | POA: Diagnosis not present

## 2023-05-06 ENCOUNTER — Ambulatory Visit
Admission: RE | Admit: 2023-05-06 | Discharge: 2023-05-06 | Disposition: A | Discharge status: Home / Self Care | Payer: Medicare HMO | Source: Ambulatory Visit | Attending: Family Medicine | Admitting: Family Medicine

## 2023-05-06 DIAGNOSIS — Z1231 Encounter for screening mammogram for malignant neoplasm of breast: Secondary | ICD-10-CM | POA: Diagnosis not present

## 2023-07-13 IMAGING — MG MM DIGITAL SCREENING BILAT W/ TOMO AND CAD
8 series · 9 of 24 positions shown · non-contrast
Comparison: Previous exam(s).

CLINICAL DATA: Screening.

EXAM:
DIGITAL SCREENING BILATERAL MAMMOGRAM WITH TOMOSYNTHESIS AND CAD
TECHNIQUE: Bilateral screening digital craniocaudal and mediolateral oblique
mammograms were obtained. Bilateral screening digital breast
tomosynthesis was performed. The images were evaluated with
computer-aided detection.

[R MLO synth-2D]
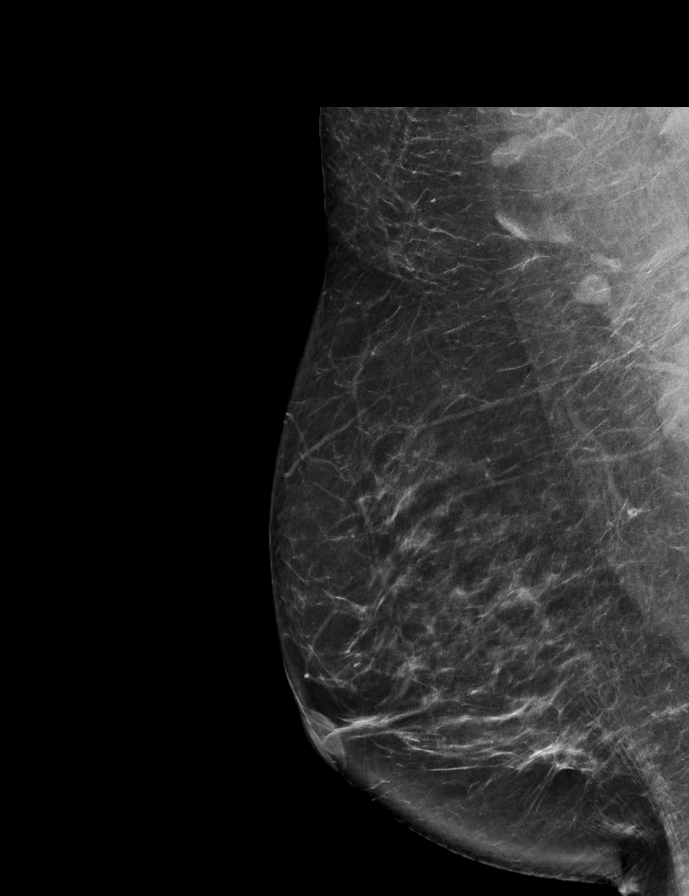

[L CC synth-2D]
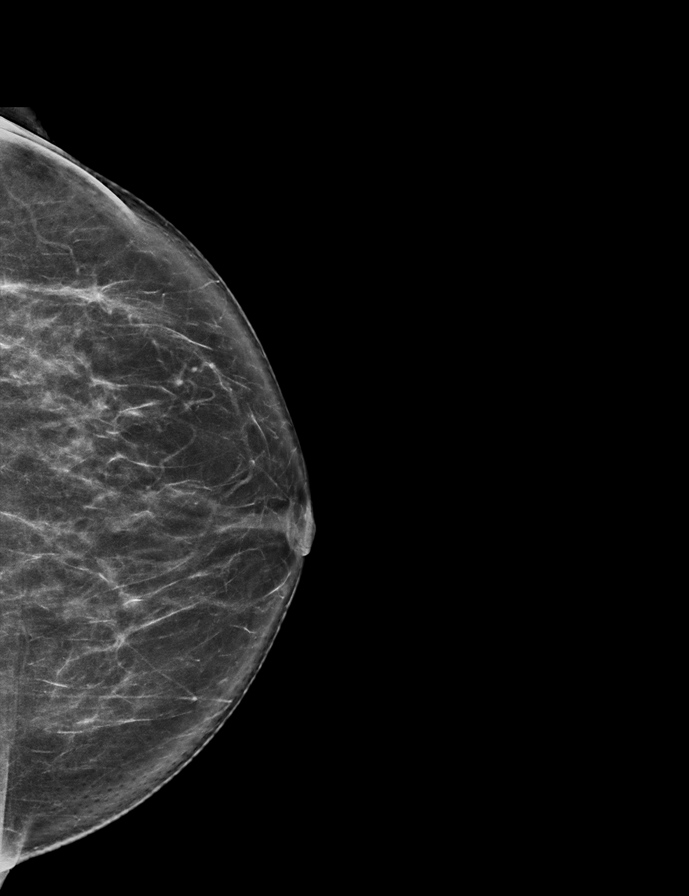

[L MLO synth-2D]
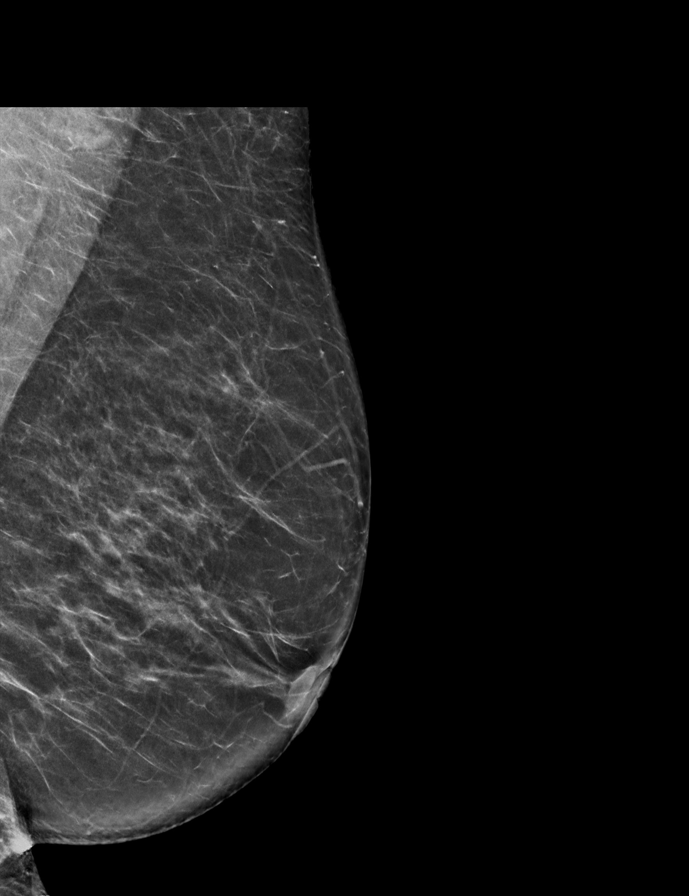

[R CC synth-2D]
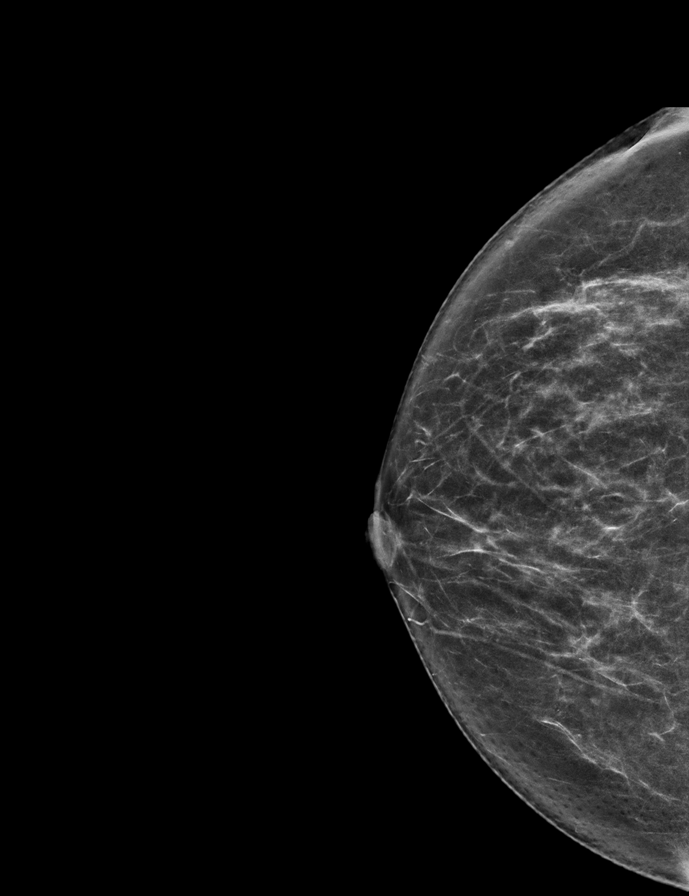

[L CC tomo · 2 of 75 frames shown]
[frame 25/75]
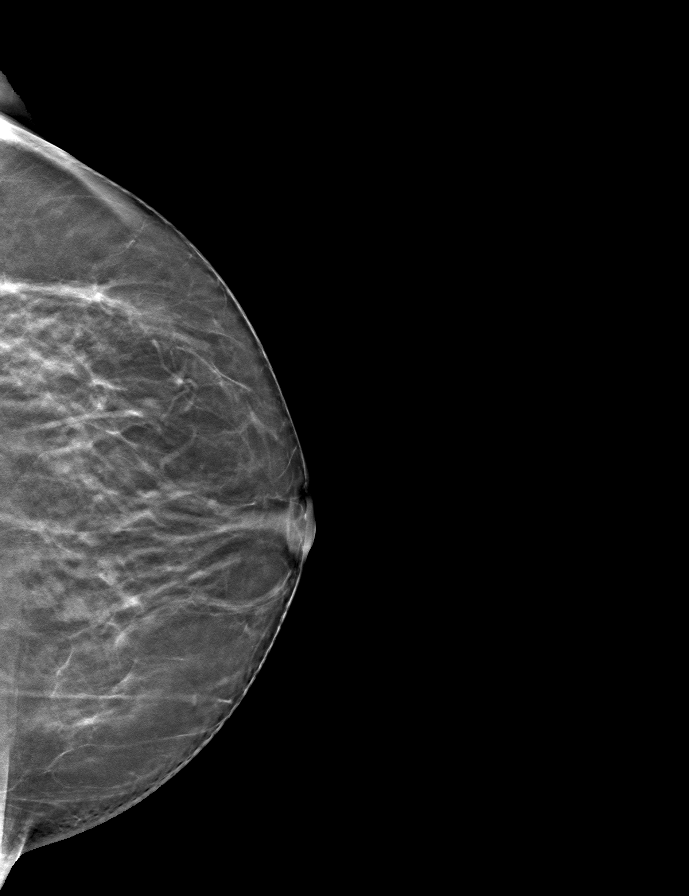
[frame 38/75]
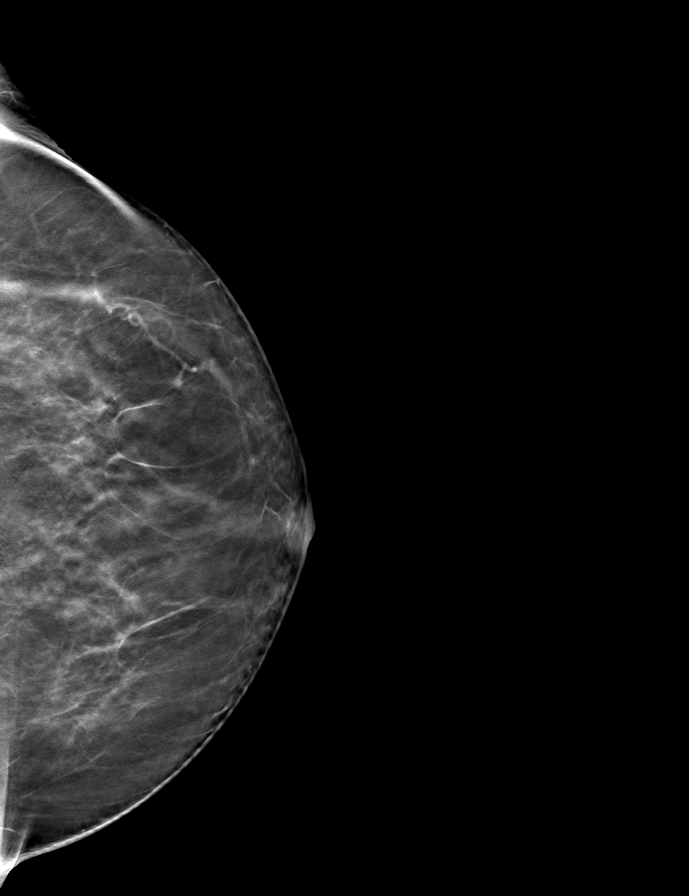

[R MLO tomo · tomo slice 41/81.0]
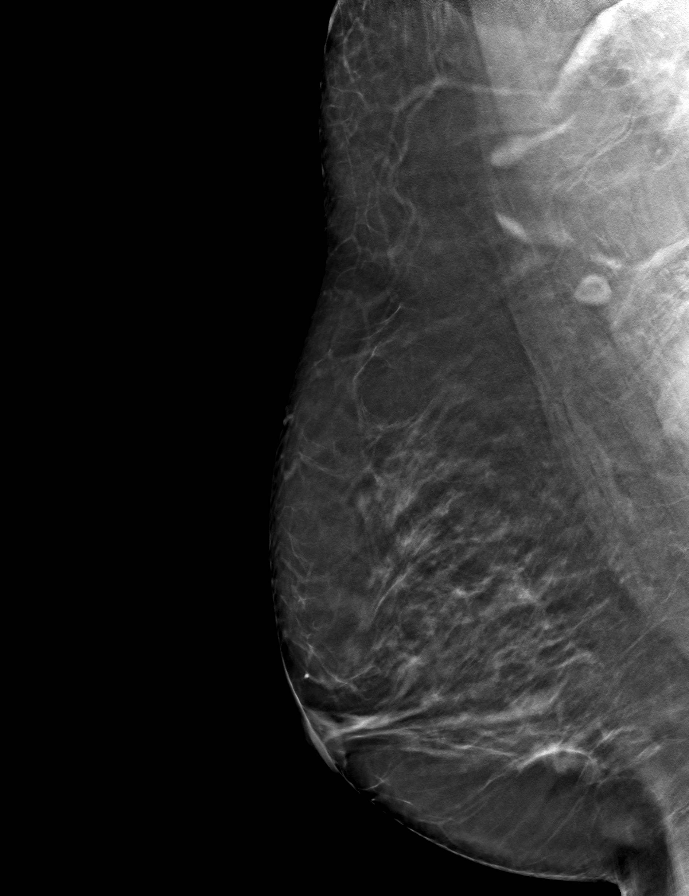

[R CC tomo · tomo slice 35/69.0]
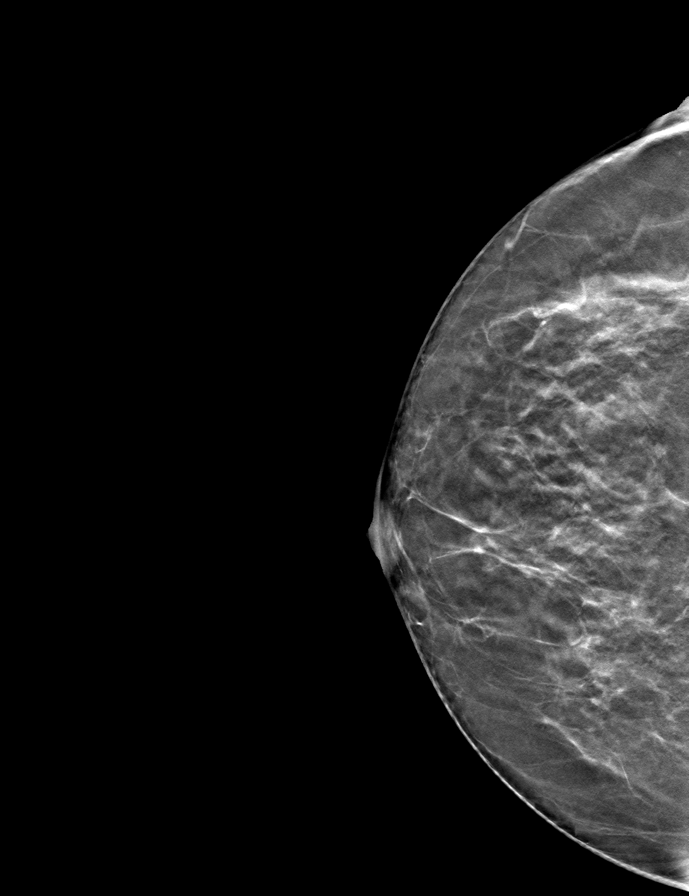

[L MLO tomo · tomo slice 39/76.0]
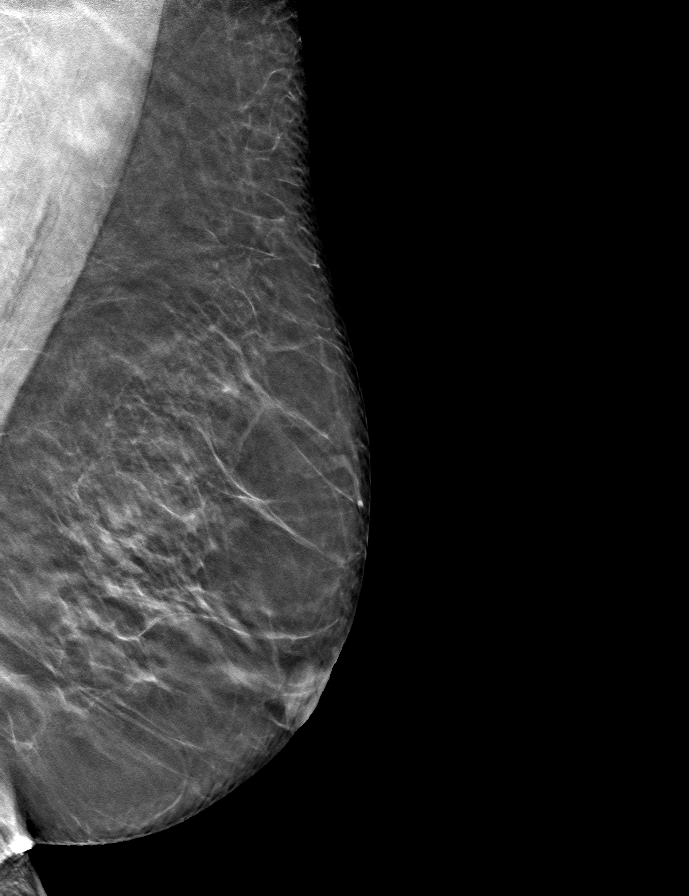

[9 of 24 positions shown; findings below may reference images not displayed]

ACR Breast Density Category b: There are scattered areas of
fibroglandular density.
FINDINGS: There are no findings suspicious for malignancy.
IMPRESSION: No mammographic evidence of malignancy. A result letter of this
screening mammogram will be mailed directly to the patient.

RECOMMENDATION:
Screening mammogram in one year. (Code:51-O-LD2)

BI-RADS CATEGORY  1: Negative.

## 2024-03-15 DIAGNOSIS — I10 Essential (primary) hypertension: Secondary | ICD-10-CM | POA: Diagnosis not present

## 2024-03-15 DIAGNOSIS — E782 Mixed hyperlipidemia: Secondary | ICD-10-CM | POA: Diagnosis not present

## 2024-04-02 DIAGNOSIS — Z23 Encounter for immunization: Secondary | ICD-10-CM | POA: Diagnosis not present

## 2024-04-05 ENCOUNTER — Other Ambulatory Visit: Payer: Self-pay | Admitting: Family Medicine

## 2024-04-05 DIAGNOSIS — Z1231 Encounter for screening mammogram for malignant neoplasm of breast: Secondary | ICD-10-CM

## 2024-04-15 DIAGNOSIS — E782 Mixed hyperlipidemia: Secondary | ICD-10-CM | POA: Diagnosis not present

## 2024-04-15 DIAGNOSIS — I10 Essential (primary) hypertension: Secondary | ICD-10-CM | POA: Diagnosis not present

## 2024-05-09 ENCOUNTER — Ambulatory Visit
Admission: RE | Admit: 2024-05-09 | Discharge: 2024-05-09 | Disposition: A | Discharge status: Home / Self Care | Source: Ambulatory Visit | Attending: Family Medicine | Admitting: Family Medicine

## 2024-05-09 DIAGNOSIS — Z1231 Encounter for screening mammogram for malignant neoplasm of breast: Secondary | ICD-10-CM | POA: Diagnosis not present

## 2024-05-15 DIAGNOSIS — I10 Essential (primary) hypertension: Secondary | ICD-10-CM | POA: Diagnosis not present

## 2024-05-15 DIAGNOSIS — E782 Mixed hyperlipidemia: Secondary | ICD-10-CM | POA: Diagnosis not present
